# Patient Record
Sex: Female | Born: 1995 | Race: Black or African American | Hispanic: No | Marital: Single | Smoking: Never smoker
Health system: Southern US, Community
[De-identification: ages and names within clinical notes are randomized; demographics above are authoritative.]

## PROBLEM LIST (undated history)

## (undated) DIAGNOSIS — K589 Irritable bowel syndrome without diarrhea: Secondary | ICD-10-CM

---

## 2017-07-05 ENCOUNTER — Emergency Department (HOSPITAL_COMMUNITY): Payer: No Typology Code available for payment source

## 2017-07-05 ENCOUNTER — Encounter (HOSPITAL_COMMUNITY): Payer: Self-pay

## 2017-07-05 ENCOUNTER — Other Ambulatory Visit: Payer: Self-pay

## 2017-07-05 ENCOUNTER — Emergency Department (HOSPITAL_COMMUNITY)
Admission: EM | Admit: 2017-07-05 | Discharge: 2017-07-05 | Disposition: A | Discharge status: Home / Self Care | Payer: No Typology Code available for payment source | Attending: Emergency Medicine | Admitting: Emergency Medicine

## 2017-07-05 DIAGNOSIS — Y998 Other external cause status: Secondary | ICD-10-CM | POA: Diagnosis not present

## 2017-07-05 DIAGNOSIS — Y9389 Activity, other specified: Secondary | ICD-10-CM | POA: Diagnosis not present

## 2017-07-05 DIAGNOSIS — T148XXA Other injury of unspecified body region, initial encounter: Secondary | ICD-10-CM | POA: Diagnosis not present

## 2017-07-05 DIAGNOSIS — Y92410 Unspecified street and highway as the place of occurrence of the external cause: Secondary | ICD-10-CM | POA: Diagnosis not present

## 2017-07-05 DIAGNOSIS — M545 Low back pain, unspecified: Secondary | ICD-10-CM

## 2017-07-05 HISTORY — DX: Irritable bowel syndrome without diarrhea: K58.9

## 2017-07-05 LAB — URINALYSIS, ROUTINE W REFLEX MICROSCOPIC
Bilirubin Urine: NEGATIVE
GLUCOSE, UA: NEGATIVE mg/dL
HGB URINE DIPSTICK: NEGATIVE
Ketones, ur: NEGATIVE mg/dL
LEUKOCYTES UA: NEGATIVE
Nitrite: NEGATIVE
PROTEIN: NEGATIVE mg/dL
SPECIFIC GRAVITY, URINE: 1.017 (ref 1.005–1.030)
pH: 7 (ref 5.0–8.0)

## 2017-07-05 LAB — POC URINE PREG, ED: PREG TEST UR: NEGATIVE

## 2017-07-05 MED ORDER — KETOROLAC TROMETHAMINE 60 MG/2ML IM SOLN
60.0000 mg | Freq: Once | INTRAMUSCULAR | Status: AC
  Administered 2017-07-05: 60 mg via INTRAMUSCULAR
  Filled 2017-07-05: qty 2

## 2017-07-05 MED ORDER — CYCLOBENZAPRINE HCL 10 MG PO TABS: 10.0000 mg | ORAL_TABLET | Freq: Two times a day (BID) | ORAL | 0 refills | Status: DC | PRN
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
## 2017-07-05 NOTE — ED Notes (Signed)
Pt given drinks 
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
## 2018-07-06 ENCOUNTER — Encounter (HOSPITAL_COMMUNITY): Payer: Self-pay | Admitting: *Deleted

## 2018-07-06 ENCOUNTER — Ambulatory Visit (HOSPITAL_COMMUNITY)
Admission: EM | Admit: 2018-07-06 | Discharge: 2018-07-06 | Disposition: A | Discharge status: Home / Self Care | Payer: BLUE CROSS/BLUE SHIELD | Attending: Family Medicine | Admitting: Family Medicine

## 2018-07-06 ENCOUNTER — Other Ambulatory Visit: Payer: Self-pay

## 2018-07-06 DIAGNOSIS — R05 Cough: Secondary | ICD-10-CM | POA: Diagnosis present

## 2018-07-06 DIAGNOSIS — B349 Viral infection, unspecified: Secondary | ICD-10-CM | POA: Diagnosis not present

## 2018-07-06 DIAGNOSIS — Z975 Presence of (intrauterine) contraceptive device: Secondary | ICD-10-CM | POA: Diagnosis not present

## 2018-07-06 DIAGNOSIS — M791 Myalgia, unspecified site: Secondary | ICD-10-CM

## 2018-07-06 DIAGNOSIS — K589 Irritable bowel syndrome without diarrhea: Secondary | ICD-10-CM | POA: Insufficient documentation

## 2018-07-06 DIAGNOSIS — J029 Acute pharyngitis, unspecified: Secondary | ICD-10-CM | POA: Diagnosis not present

## 2018-07-06 NOTE — ED Provider Notes (Signed)
[facility]    CSN: [id number] Arrival date & time: 07/06/18  1133     History   Chief Complaint Chief Complaint  Patient presents with  . Cough  . Generalized Body Aches    HPI Heidi Hughes is a 22 y.o. female.   The history is provided by the patient. No language interpreter was used.  Cough  Cough characteristics:  Non-productive Sputum characteristics:  Nondescript Severity:  Moderate Onset quality:  Gradual Timing:  Constant Progression:  Worsening Chronicity:  New Smoker: no   Context: not sick contacts   Relieved by:  Nothing Ineffective treatments:  None tried Associated symptoms: myalgias and sore throat   Risk factors: no recent infection    Pt complains of cough and congestion.  Past Medical History:  Diagnosis Date  . IBS (irritable bowel syndrome)     There are no active problems to display for this patient.   History reviewed. No pertinent surgical history.  OB History   None      Home Medications    Prior to Admission medications   Not on File    Family History Family History  Problem Relation Age of Onset  . Lupus Mother     Social History Social History   Tobacco Use  . Smoking status: Never Smoker  . Smokeless tobacco: Never Used  Substance Use Topics  . Alcohol use: No    Frequency: Never  . Drug use: Not Currently    Types: Marijuana     Allergies   Patient has no known allergies.   Review of Systems Review of Systems  HENT: Positive for sore throat.   Respiratory: Positive for cough.   Musculoskeletal: Positive for myalgias.  All other systems reviewed and are negative.    Physical Exam Triage Vital Signs ED Triage Vitals  Enc Vitals Group     BP 07/06/18 1152 112/73     Pulse Rate 07/06/18 1152 68     Resp 07/06/18 1152 16     Temp 07/06/18 1152 98.2 F (36.8 C)     Temp Source 07/06/18 1152 Oral     SpO2 07/06/18 1152 99 %     Weight --      Height --      Head Circumference  --      Peak Flow --      Pain Score 07/06/18 1153 8     Pain Loc --      Pain Edu? --      Excl. in GC? --    No data found.  Updated Vital Signs BP 112/73   Pulse 68   Temp 98.2 F (36.8 C) (Oral)   Resp 16   LMP 06/09/2018 (Approximate) Comment: has IUD in place  SpO2 99%   Visual Acuity Right Eye Distance:   Left Eye Distance:   Bilateral Distance:    Right Eye Near:   Left Eye Near:    Bilateral Near:     Physical Exam  Constitutional: She appears well-developed and well-nourished.  HENT:  Head: Normocephalic.  Eyes: Pupils are equal, round, and reactive to light.  Neck: Normal range of motion.  Cardiovascular: Normal rate.  Pulmonary/Chest: Effort normal.  Abdominal: Soft.  Musculoskeletal: Normal range of motion.  Neurological: She is alert.  Skin: Skin is warm.  Psychiatric: She has a normal mood and affect.  Nursing note and vitals reviewed.    UC Treatments / Results  Labs (all labs ordered are listed, but  only abnormal results are displayed) Labs Reviewed - No data to display  EKG None  Radiology No results found.  Procedures Procedures (including critical care time)  Medications Ordered in UC Medications - No data to display  Initial Impression / Assessment and Plan / UC Course  I have reviewed the triage vital signs and the nursing notes.  Pertinent labs & imaging results that were available during my care of the patient were reviewed by me and considered in my medical decision making (see chart for details).     Pt advised she most likely has a viral illness.  Pt advised symptomatic care.  Final Clinical Impressions(s) / UC Diagnoses   Final diagnoses:  Viral illness   Discharge Instructions   None    ED Prescriptions    None     Controlled Substance Prescriptions [place] Controlled Substance Registry consulted? Not Applicable  An After Visit Summary was printed and given to the patient.    Elson Areas, [place] 07/06/18  1216

## 2018-07-06 NOTE — ED Triage Notes (Signed)
Reports starting last night with body aches, chills, congestion, cough, sore throat, HA; has had one episode vomiting.

## 2018-07-06 NOTE — Discharge Instructions (Addendum)
Return if any problems.

## 2018-07-07 LAB — POCT RAPID STREP A: STREPTOCOCCUS, GROUP A SCREEN (DIRECT): NEGATIVE

## 2018-07-08 LAB — CULTURE, GROUP A STREP (THRC)
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
## 2019-11-26 ENCOUNTER — Ambulatory Visit: Payer: Medicaid Other | Attending: Family

## 2019-11-26 DIAGNOSIS — Z23 Encounter for immunization: Secondary | ICD-10-CM

## 2019-11-26 NOTE — Progress Notes (Signed)
   Covid-19 Vaccination Clinic  Name:  Heidi Hughes    MRN: [id number] DOB: 09-25-1995  11/26/2019  [name] was observed post Covid-19 immunization for 15 minutes without incident. She was provided with Vaccine Information Sheet and instruction to access the V-Safe system.   [name] was instructed to call 911 with any severe reactions post vaccine: Marland Kitchen Difficulty breathing  . Swelling of face and throat  . A fast heartbeat  . A bad rash all over body  . Dizziness and weakness   Immunizations Administered    Name Date Dose VIS Date Route   Moderna COVID-19 Vaccine 11/26/2019 11:59 AM 0.5 mL 07/28/2019 Intramuscular   Manufacturer: Moderna   Lot: [id number]   NDC: 47096-283-66

## 2019-12-29 ENCOUNTER — Ambulatory Visit: Payer: Medicaid Other | Attending: Family

## 2019-12-29 DIAGNOSIS — Z23 Encounter for immunization: Secondary | ICD-10-CM

## 2019-12-29 NOTE — Progress Notes (Signed)
   Covid-19 Vaccination Clinic  Name:  Heidi Hughes    MRN: [id number] DOB: Nov 16, 1995  12/29/2019  [name] was observed post Covid-19 immunization for 15 minutes without incident. She was provided with Vaccine Information Sheet and instruction to access the V-Safe system.   [name] was instructed to call 911 with any severe reactions post vaccine: Marland Kitchen Difficulty breathing  . Swelling of face and throat  . A fast heartbeat  . A bad rash all over body  . Dizziness and weakness   Immunizations Administered    Name Date Dose VIS Date Route   Moderna COVID-19 Vaccine 12/29/2019 10:26 AM 0.5 mL 07/2019 Intramuscular   Manufacturer: Moderna   Lot: [id number]   NDC: 93968-864-84

## 2020-10-28 ENCOUNTER — Ambulatory Visit (HOSPITAL_COMMUNITY)
Admission: EM | Admit: 2020-10-28 | Discharge: 2020-10-28 | Disposition: A | Discharge status: Home / Self Care | Payer: 59

## 2020-10-28 ENCOUNTER — Other Ambulatory Visit: Payer: Self-pay

## 2020-10-28 ENCOUNTER — Ambulatory Visit (INDEPENDENT_AMBULATORY_CARE_PROVIDER_SITE_OTHER): Payer: 59

## 2020-10-28 ENCOUNTER — Encounter (HOSPITAL_COMMUNITY): Payer: Self-pay

## 2020-10-28 DIAGNOSIS — R079 Chest pain, unspecified: Secondary | ICD-10-CM

## 2020-10-28 MED ORDER — LIDOCAINE VISCOUS HCL 2 % MT SOLN
OROMUCOSAL | Status: AC
  Filled 2020-10-28: qty 15

## 2020-10-28 MED ORDER — OMEPRAZOLE 20 MG PO CPDR: 20.0000 mg | DELAYED_RELEASE_CAPSULE | Freq: Every day | ORAL | 0 refills | Status: AC

## 2020-10-28 MED ORDER — ALUM & MAG HYDROXIDE-SIMETH 200-200-20 MG/5ML PO SUSP
ORAL | Status: AC
  Filled 2020-10-28: qty 30

## 2020-10-28 MED ORDER — ALUM & MAG HYDROXIDE-SIMETH 200-200-20 MG/5ML PO SUSP
30.0000 mL | Freq: Once | ORAL | Status: AC
  Administered 2020-10-28: 30 mL via ORAL

## 2020-10-28 MED ORDER — LIDOCAINE VISCOUS HCL 2 % MT SOLN
15.0000 mL | Freq: Once | OROMUCOSAL | Status: AC
  Administered 2020-10-28: 15 mL via ORAL

## 2020-10-28 NOTE — ED Provider Notes (Signed)
[facility]    CSN: [id number] Arrival date & time: 10/28/20  0102      History   Chief Complaint Chief Complaint  Patient presents with  . Chest Pain    HPI Heidi Hughes is a 25 y.o. female.   Heidi Hughes presents with complaints of chest pain. She woke with chest pain last night, central and left side. Worse with deep breathing. radiates to back. Sitting upright helped. No cough. No leg or calf pain. She smokes cigarettes. She is not on any oral birth control but does have an IUD. No recent travel or immobilization. No previous similar pain, although a month ago she had significant cough which requires inhaler. History of reflux, doesn't take any medications for this. She states she ate calamari last night for dinner before symptoms started. No other gi symptoms. Doesn't feel shortness of breath.    ROS per HPI, negative if not otherwise mentioned.      Past Medical History:  Diagnosis Date  . IBS (irritable bowel syndrome)     There are no problems to display for this patient.   History reviewed. No pertinent surgical history.  OB History   No obstetric history on file.      Home Medications    Prior to Admission medications   Medication Sig Start Date End Date Taking? Authorizing Provider  albuterol (VENTOLIN HFA) 108 (90 Base) MCG/ACT inhaler Inhale into the lungs. 09/22/20  Yes [provider]  cetirizine (ZYRTEC) 10 MG tablet Take 1 tablet by mouth daily. 01/06/20  Yes [provider]  FLUoxetine (PROZAC) 10 MG capsule Take one capsule by mouth daily for 7 days; then start taking two (2) capsules by mouth once daily. 01/13/20  Yes [provider]  omeprazole (PRILOSEC) 20 MG capsule Take 1 capsule (20 mg total) by mouth daily. 10/28/20  Yes Georgetta Haber, NP    Family History Family History  Problem Relation Age of Onset  . Lupus Mother     Social History Social History   Tobacco Use  . Smoking status:  Never Smoker  . Smokeless tobacco: Never Used  Vaping Use  . Vaping Use: Never used  Substance Use Topics  . Alcohol use: No  . Drug use: Not Currently    Types: Marijuana     Allergies   Patient has no known allergies.   Review of Systems Review of Systems   Physical Exam Triage Vital Signs ED Triage Vitals  Enc Vitals Group     BP 10/28/20 0956 119/66     Pulse Rate 10/28/20 0956 61     Resp 10/28/20 0956 19     Temp 10/28/20 0956 99.2 F (37.3 C)     Temp src --      SpO2 10/28/20 0956 100 %     Weight --      Height --      Head Circumference --      Peak Flow --      Pain Score 10/28/20 0951 5     Pain Loc --      Pain Edu? --      Excl. in GC? --    No data found.  Updated Vital Signs BP 119/66   Pulse 61   Temp 99.2 F (37.3 C)   Resp 19   LMP 10/01/2020 (Approximate)   SpO2 100%   Visual Acuity Right Eye Distance:   Left Eye Distance:   Bilateral Distance:  Right Eye Near:   Left Eye Near:    Bilateral Near:     Physical Exam Constitutional:      General: She is not in acute distress.    Appearance: She is well-developed.  Cardiovascular:     Rate and Rhythm: Normal rate.  Pulmonary:     Effort: Pulmonary effort is normal.     Breath sounds: Normal breath sounds.     Comments: Shallow breathing related to pain Chest:     Chest wall: No mass or tenderness.  Musculoskeletal:     Comments: Bilateral calves are soft and non tender   Skin:    General: Skin is warm and dry.  Neurological:     Mental Status: She is alert and oriented to person, place, and time.    EKG:  NSR rate of 64 . Previous EKG was not available for review. No stwave changes as interpreted by me.    UC Treatments / Results  Labs (all labs ordered are listed, but only abnormal results are displayed) Labs Reviewed - No data to display  EKG   Radiology DG Chest 2 View  Result Date: 10/28/2020 CLINICAL DATA:  Left-sided chest pain. EXAM: CHEST - 2 VIEW  COMPARISON:  None. FINDINGS: The heart size and mediastinal contours are within normal limits. There is no evidence of pulmonary edema, consolidation, pneumothorax, nodule or pleural fluid. There is a rightward convex scoliosis of the thoracic spine. IMPRESSION: No active cardiopulmonary disease. Electronically Signed   By: Irish Lack M.D.   On: 10/28/2020 10:50    Procedures Procedures (including critical care time)  Medications Ordered in UC Medications  alum & mag hydroxide-simeth (MAALOX/MYLANTA) 200-200-20 MG/5ML suspension 30 mL (30 mLs Oral Given 10/28/20 1030)    And  lidocaine (XYLOCAINE) 2 % viscous mouth solution 15 mL (15 mLs Oral Given 10/28/20 1030)    Initial Impression / Assessment and Plan / UC Course  I have reviewed the triage vital signs and the nursing notes.  Pertinent labs & imaging results that were available during my care of the patient were reviewed by me and considered in my medical decision making (see chart for details).     Chest xray and ekg without acute findings here today. Symptoms improved with gi cocktail here today. History of reflux and symptoms are consistent with reflux. Omeprazole initiated today and follow up and er precautions provided. HR stable, no hypoxia, no fever, wells score of 0, no cardiac history. Patient verbalized understanding and agreeable to plan.  Ambulatory out of clinic without difficulty.    Final Clinical Impressions(s) / UC Diagnoses   Final diagnoses:  Nonspecific chest pain     Discharge Instructions     Your ekg and chest xray look well today which is reassuring in regards to your pain.  I would like you to start daily omeprazole to see if your symptoms improve. Additionally, see information about diet changes which may help prevent pain like this.  If persistent pain please follow up with your primary care provider as you may need further evaluation.  If you develop shortness of breath , worsening of pain,  dizziness, or otherwise worsening please go to the ER    ED Prescriptions    Medication Sig Dispense Auth. Provider   omeprazole (PRILOSEC) 20 MG capsule Take 1 capsule (20 mg total) by mouth daily. 30 capsule Georgetta Haber, NP     PDMP not reviewed this encounter.   Georgetta Haber, NP 10/28/20  1117  

## 2020-10-28 NOTE — ED Triage Notes (Signed)
Pt in with c/o mid to left sharp cp that started last night. States when she takes deep breaths the pain goes to her back  Pt took arthritis pain medicine and inhaler with minimal relief  Denies sob, vomiting

## 2020-10-28 NOTE — Discharge Instructions (Signed)
Your ekg and chest xray look well today which is reassuring in regards to your pain.  I would like you to start daily omeprazole to see if your symptoms improve. Additionally, see information about diet changes which may help prevent pain like this.  If persistent pain please follow up with your primary care provider as you may need further evaluation.  If you develop shortness of breath , worsening of pain, dizziness, or otherwise worsening please go to the ER

## 2022-05-14 IMAGING — DX DG CHEST 2V
2 series · 2 of 2 positions shown · non-contrast
Comparison: None.

CLINICAL DATA: Left-sided chest pain.

EXAM:
CHEST - 2 VIEW

[chest pa]
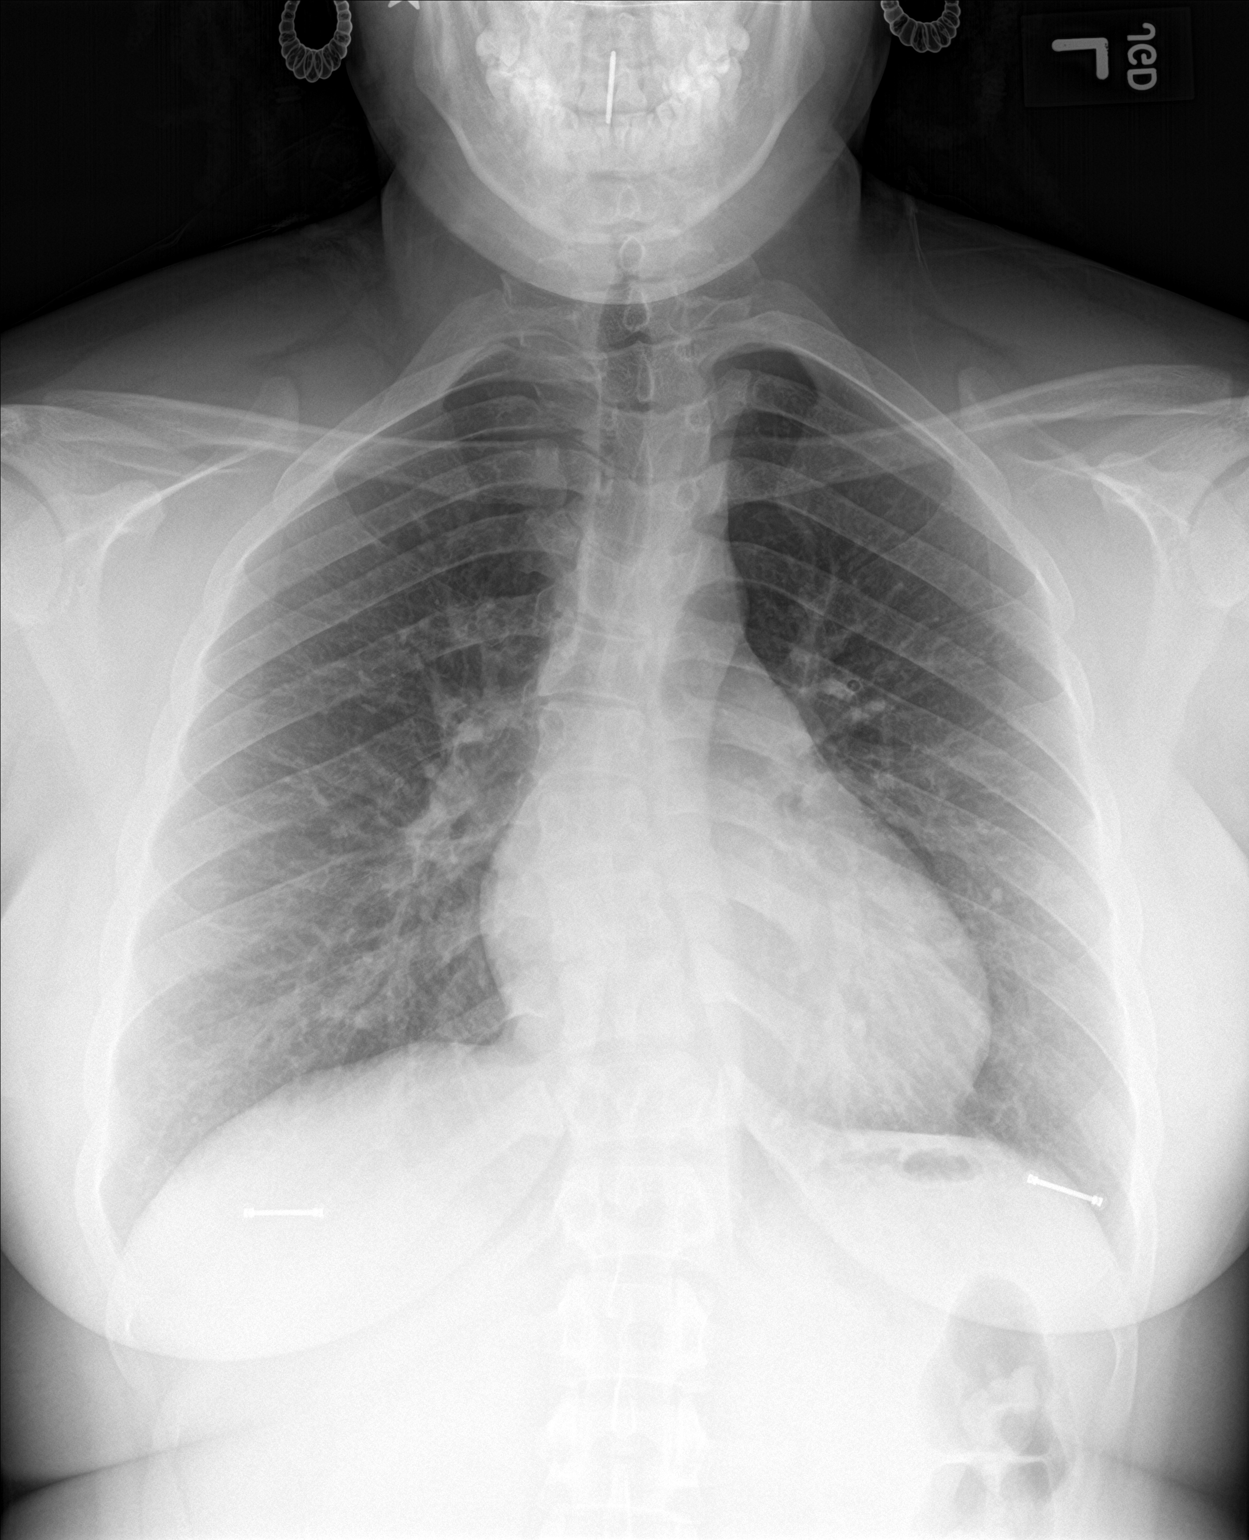

[chest lat]
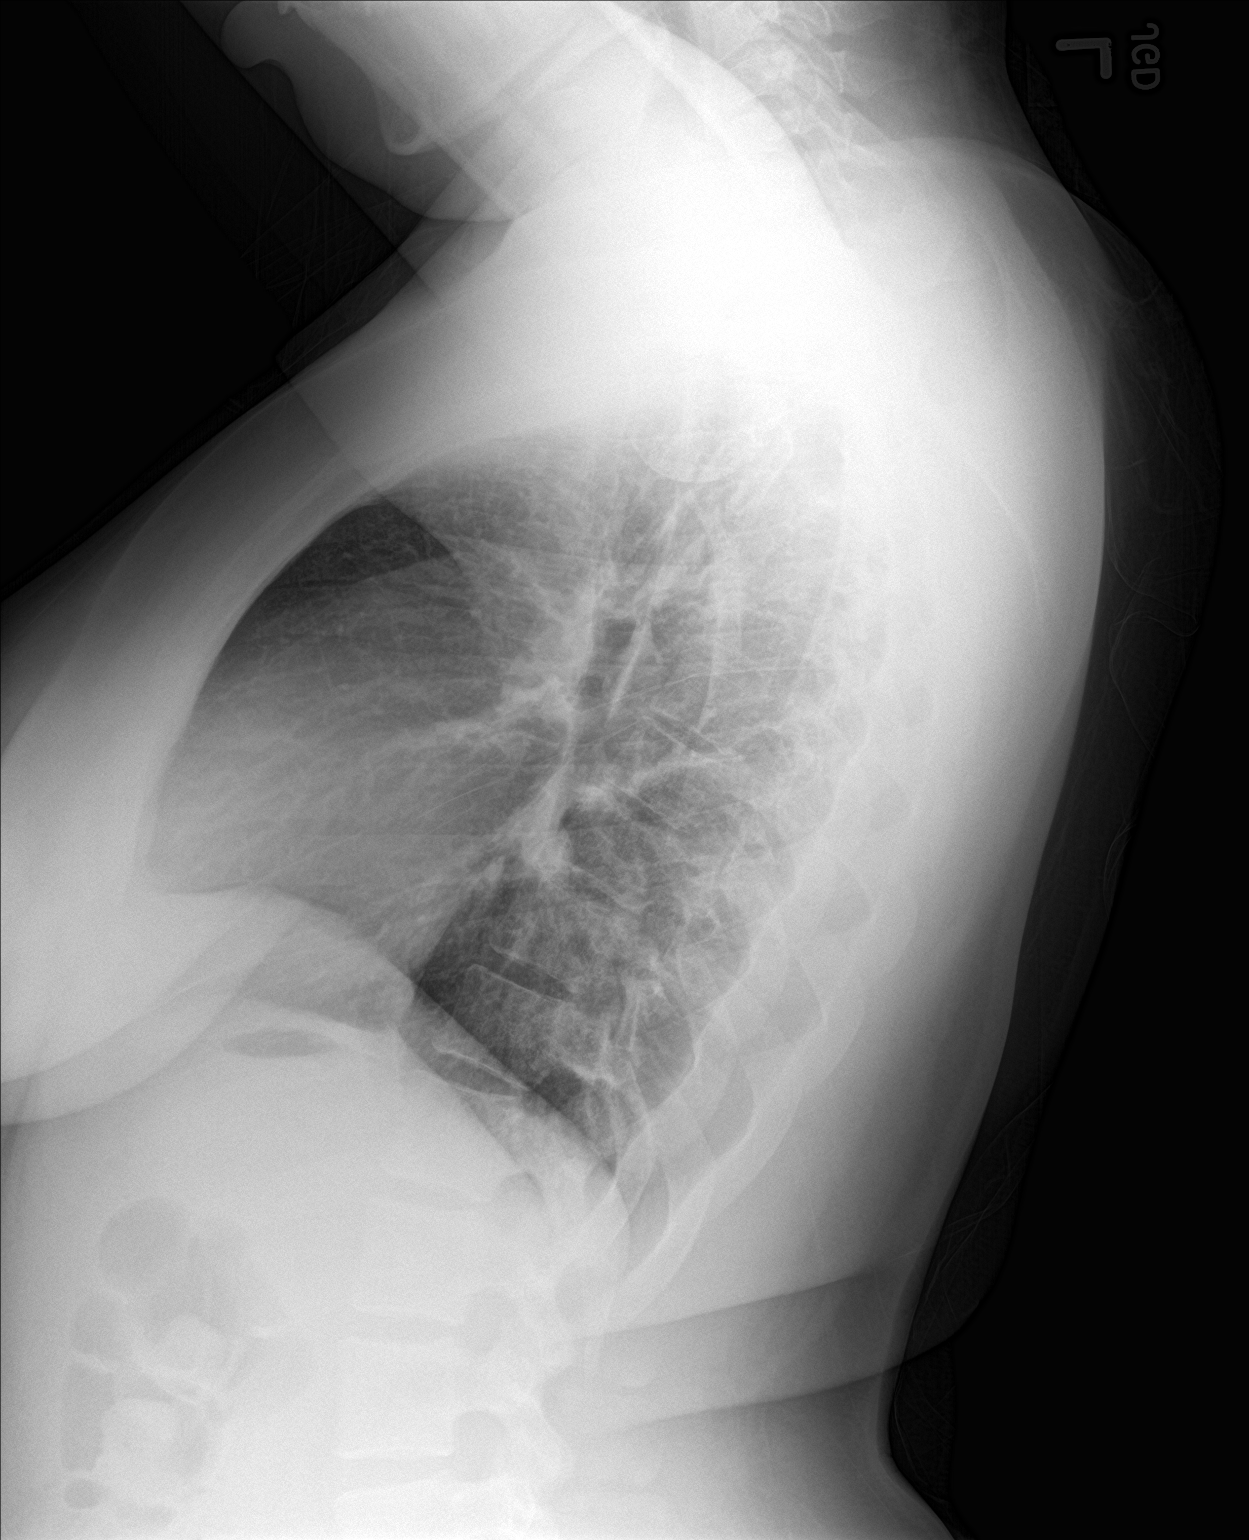

[2 of 2 positions shown; findings below may reference images not displayed]

FINDINGS: The heart size and mediastinal contours are within normal limits.
There is no evidence of pulmonary edema, consolidation,
pneumothorax, nodule or pleural fluid. There is a rightward convex
scoliosis of the thoracic spine.
IMPRESSION: No active cardiopulmonary disease.
